# Patient Record
Sex: Male | Born: 1995 | Race: White | Hispanic: No | Marital: Single
Health system: Southern US, Community
[De-identification: ages and names within clinical notes are randomized; demographics above are authoritative.]

---

## 2020-11-10 ENCOUNTER — Emergency Department (HOSPITAL_COMMUNITY): Payer: Self-pay

## 2020-11-10 ENCOUNTER — Inpatient Hospital Stay (HOSPITAL_COMMUNITY)
Admission: EM | Admit: 2020-11-10 | Discharge: 2020-11-19 | DRG: 604 | Disposition: A | Discharge status: Home / Self Care | Payer: Self-pay | Attending: Surgery | Admitting: Surgery

## 2020-11-10 ENCOUNTER — Other Ambulatory Visit: Payer: Self-pay

## 2020-11-10 DIAGNOSIS — S21332D Puncture wound without foreign body of left front wall of thorax with penetration into thoracic cavity, subsequent encounter: Secondary | ICD-10-CM

## 2020-11-10 DIAGNOSIS — F1290 Cannabis use, unspecified, uncomplicated: Secondary | ICD-10-CM | POA: Diagnosis present

## 2020-11-10 DIAGNOSIS — S271XXA Traumatic hemothorax, initial encounter: Secondary | ICD-10-CM

## 2020-11-10 DIAGNOSIS — W3400XA Accidental discharge from unspecified firearms or gun, initial encounter: Secondary | ICD-10-CM

## 2020-11-10 DIAGNOSIS — J181 Lobar pneumonia, unspecified organism: Secondary | ICD-10-CM

## 2020-11-10 DIAGNOSIS — S272XXA Traumatic hemopneumothorax, initial encounter: Secondary | ICD-10-CM | POA: Diagnosis present

## 2020-11-10 DIAGNOSIS — F10129 Alcohol abuse with intoxication, unspecified: Secondary | ICD-10-CM | POA: Diagnosis present

## 2020-11-10 DIAGNOSIS — Y929 Unspecified place or not applicable: Secondary | ICD-10-CM

## 2020-11-10 DIAGNOSIS — D62 Acute posthemorrhagic anemia: Secondary | ICD-10-CM | POA: Diagnosis present

## 2020-11-10 DIAGNOSIS — S21332A Puncture wound without foreign body of left front wall of thorax with penetration into thoracic cavity, initial encounter: Secondary | ICD-10-CM

## 2020-11-10 DIAGNOSIS — S2242XA Multiple fractures of ribs, left side, initial encounter for closed fracture: Secondary | ICD-10-CM | POA: Diagnosis present

## 2020-11-10 DIAGNOSIS — U071 COVID-19: Secondary | ICD-10-CM | POA: Diagnosis present

## 2020-11-10 DIAGNOSIS — F1010 Alcohol abuse, uncomplicated: Secondary | ICD-10-CM | POA: Diagnosis present

## 2020-11-10 DIAGNOSIS — S27321A Contusion of lung, unilateral, initial encounter: Secondary | ICD-10-CM | POA: Diagnosis present

## 2020-11-10 DIAGNOSIS — J942 Hemothorax: Secondary | ICD-10-CM

## 2020-11-10 DIAGNOSIS — J9811 Atelectasis: Secondary | ICD-10-CM | POA: Diagnosis present

## 2020-11-10 DIAGNOSIS — Y906 Blood alcohol level of 120-199 mg/100 ml: Secondary | ICD-10-CM | POA: Diagnosis present

## 2020-11-10 DIAGNOSIS — Z4682 Encounter for fitting and adjustment of non-vascular catheter: Secondary | ICD-10-CM

## 2020-11-10 DIAGNOSIS — J939 Pneumothorax, unspecified: Secondary | ICD-10-CM

## 2020-11-10 DIAGNOSIS — F17200 Nicotine dependence, unspecified, uncomplicated: Secondary | ICD-10-CM | POA: Diagnosis present

## 2020-11-10 DIAGNOSIS — S21132A Puncture wound without foreign body of left front wall of thorax without penetration into thoracic cavity, initial encounter: Principal | ICD-10-CM | POA: Diagnosis present

## 2020-11-10 DIAGNOSIS — Z23 Encounter for immunization: Secondary | ICD-10-CM

## 2020-11-10 LAB — COMPREHENSIVE METABOLIC PANEL
ALT: 14 U/L (ref 0–44)
AST: 19 U/L (ref 15–41)
Albumin: 3.3 g/dL — ABNORMAL LOW (ref 3.5–5.0)
Alkaline Phosphatase: 56 U/L (ref 38–126)
Anion gap: 12 (ref 5–15)
BUN: 11 mg/dL (ref 6–20)
CO2: 20 mmol/L — ABNORMAL LOW (ref 22–32)
Calcium: 8.1 mg/dL — ABNORMAL LOW (ref 8.9–10.3)
Chloride: 105 mmol/L (ref 98–111)
Creatinine, Ser: 0.99 mg/dL (ref 0.61–1.24)
GFR, Estimated: >60 mL/min (ref >60)
Glucose, Bld: 145 mg/dL — ABNORMAL HIGH (ref 70–99)
Potassium: 3.3 mmol/L — ABNORMAL LOW (ref 3.5–5.1)
Sodium: 137 mmol/L (ref 135–145)
Total Bilirubin: 0.2 mg/dL — ABNORMAL LOW (ref 0.3–1.2)
Total Protein: 5.3 g/dL — ABNORMAL LOW (ref 6.5–8.1)

## 2020-11-10 LAB — RESP PANEL BY RT-PCR (FLU A&B, COVID) ARPGX2
Influenza A by PCR: NEGATIVE
Influenza B by PCR: NEGATIVE
SARS Coronavirus 2 by RT PCR: POSITIVE — AB

## 2020-11-10 LAB — CBC
HCT: 36.3 % — ABNORMAL LOW (ref 39.0–52.0)
Hemoglobin: 12.4 g/dL — ABNORMAL LOW (ref 13.0–17.0)
MCH: 32.9 pg (ref 26.0–34.0)
MCHC: 34.2 g/dL (ref 30.0–36.0)
MCV: 96.3 fL (ref 80.0–100.0)
Platelets: 221 10*3/uL (ref 150–400)
RBC: 3.77 MIL/uL — ABNORMAL LOW (ref 4.22–5.81)
RDW: 13.2 % (ref 11.5–15.5)
WBC: 13.5 10*3/uL — ABNORMAL HIGH (ref 4.0–10.5)
nRBC: 0 % (ref 0.0–0.2)

## 2020-11-10 LAB — I-STAT CHEM 8, ED
BUN: 13 mg/dL (ref 6–20)
Calcium, Ion: 1.03 mmol/L — ABNORMAL LOW (ref 1.15–1.40)
Chloride: 102 mmol/L (ref 98–111)
Creatinine, Ser: 1.1 mg/dL (ref 0.61–1.24)
Glucose, Bld: 137 mg/dL — ABNORMAL HIGH (ref 70–99)
HCT: 37 % — ABNORMAL LOW (ref 39.0–52.0)
Hemoglobin: 12.6 g/dL — ABNORMAL LOW (ref 13.0–17.0)
Potassium: 3.4 mmol/L — ABNORMAL LOW (ref 3.5–5.1)
Sodium: 139 mmol/L (ref 135–145)
TCO2: 22 mmol/L (ref 22–32)

## 2020-11-10 LAB — ETHANOL: Alcohol, Ethyl (B): 161 mg/dL — ABNORMAL HIGH (ref <10)

## 2020-11-10 LAB — PROTIME-INR
INR: 1.1 (ref 0.8–1.2)
Prothrombin Time: 13.5 seconds (ref 11.4–15.2)

## 2020-11-10 MED ORDER — LIDOCAINE-EPINEPHRINE (PF) 2 %-1:200000 IJ SOLN
20.0000 mL | Freq: Once | INTRAMUSCULAR | Status: DC
  Administered 2020-11-10: 20 mL

## 2020-11-10 MED ORDER — ONDANSETRON 4 MG PO TBDP: 4.0000 mg | ORAL_TABLET | Freq: Four times a day (QID) | ORAL | Status: DC | PRN

## 2020-11-10 MED ORDER — BISACODYL 10 MG RE SUPP: 10.0000 mg | Freq: Every day | RECTAL | Status: DC | PRN

## 2020-11-10 MED ORDER — HYDROMORPHONE HCL 1 MG/ML IJ SOLN
0.5000 mg | INTRAMUSCULAR | Status: DC | PRN
  Administered 2020-11-10 – 2020-11-11 (×3): 0.5 mg via INTRAVENOUS
  Filled 2020-11-10: qty 0.5
  Filled 2020-11-10: qty 1
  Filled 2020-11-10: qty 0.5

## 2020-11-10 MED ORDER — IOHEXOL 300 MG/ML  SOLN
75.0000 mL | Freq: Once | INTRAMUSCULAR | Status: AC | PRN
  Administered 2020-11-10: 75 mL via INTRAVENOUS

## 2020-11-10 MED ORDER — ACETAMINOPHEN 325 MG PO TABS
650.0000 mg | ORAL_TABLET | Freq: Four times a day (QID) | ORAL | Status: DC
  Administered 2020-11-10 – 2020-11-11 (×4): 650 mg via ORAL
  Filled 2020-11-10 (×4): qty 2

## 2020-11-10 MED ORDER — SODIUM CHLORIDE 0.9 % IV SOLN: INTRAVENOUS | Status: DC

## 2020-11-10 MED ORDER — LIDOCAINE-EPINEPHRINE 1 %-1:100000 IJ SOLN
10.0000 mL | Freq: Once | INTRAMUSCULAR | Status: AC
  Administered 2020-11-10: 10 mL

## 2020-11-10 MED ORDER — CEFAZOLIN SODIUM-DEXTROSE 2-4 GM/100ML-% IV SOLN
2.0000 g | Freq: Once | INTRAVENOUS | Status: AC
  Administered 2020-11-10: 2 g via INTRAVENOUS

## 2020-11-10 MED ORDER — FENTANYL CITRATE (PF) 100 MCG/2ML IJ SOLN
INTRAMUSCULAR | Status: AC
  Administered 2020-11-10: 100 ug via INTRAVENOUS
  Filled 2020-11-10: qty 2

## 2020-11-10 MED ORDER — MIDAZOLAM HCL 2 MG/2ML IJ SOLN
INTRAMUSCULAR | Status: AC
  Administered 2020-11-10: 2 mg via INTRAVENOUS
  Filled 2020-11-10: qty 4

## 2020-11-10 MED ORDER — TETANUS-DIPHTH-ACELL PERTUSSIS 5-2.5-18.5 LF-MCG/0.5 IM SUSY
0.5000 mL | PREFILLED_SYRINGE | Freq: Once | INTRAMUSCULAR | Status: AC
  Administered 2020-11-10: 0.5 mL via INTRAMUSCULAR

## 2020-11-10 MED ORDER — MIDAZOLAM HCL 2 MG/2ML IJ SOLN
2.0000 mg | Freq: Once | INTRAMUSCULAR | Status: AC
  Filled 2020-11-10: qty 2

## 2020-11-10 MED ORDER — OXYCODONE HCL 5 MG PO TABS
5.0000 mg | ORAL_TABLET | ORAL | Status: DC | PRN
  Administered 2020-11-11 (×3): 5 mg via ORAL
  Filled 2020-11-10 (×3): qty 1

## 2020-11-10 MED ORDER — METHOCARBAMOL 500 MG PO TABS: 500.0000 mg | ORAL_TABLET | Freq: Four times a day (QID) | ORAL | Status: DC | PRN

## 2020-11-10 MED ORDER — ONDANSETRON HCL 4 MG/2ML IJ SOLN: 4.0000 mg | Freq: Four times a day (QID) | INTRAMUSCULAR | Status: DC | PRN

## 2020-11-10 MED ORDER — DOCUSATE SODIUM 100 MG PO CAPS
100.0000 mg | ORAL_CAPSULE | Freq: Two times a day (BID) | ORAL | Status: DC
  Administered 2020-11-11 – 2020-11-18 (×17): 100 mg via ORAL
  Filled 2020-11-10 (×17): qty 1

## 2020-11-10 MED ORDER — FENTANYL CITRATE (PF) 100 MCG/2ML IJ SOLN
100.0000 ug | Freq: Once | INTRAMUSCULAR | Status: AC
  Filled 2020-11-10: qty 2

## 2020-11-10 MED ORDER — GABAPENTIN 300 MG PO CAPS
300.0000 mg | ORAL_CAPSULE | Freq: Three times a day (TID) | ORAL | Status: DC
  Administered 2020-11-10 – 2020-11-19 (×27): 300 mg via ORAL
  Filled 2020-11-10 (×28): qty 1
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
## 2020-11-10 NOTE — Procedures (Signed)
Chest Tube Insertion Procedure Note  Indications:  Clinically significant Hemothorax  Pre-operative Diagnosis: Hemothorax  Post-operative Diagnosis: Hemothorax  Procedure Details  Informed consent was obtained for the procedure, including sedation.  Risks of lung perforation, hemorrhage, arrhythmia, and adverse drug reaction were discussed.   After sterile skin prep, using standard technique, a 28 French tube was placed in the left lateral 5th rib space.  Findings: initial chest tube output into pleurvac and about initial drainage with entry to pleural space  Estimated Blood Loss:  Minimal         Specimens:  None              Complications:  None; patient tolerated the procedure well.         Disposition: ongoing trauma workup         Condition: stable
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
## 2020-11-11 ENCOUNTER — Inpatient Hospital Stay (HOSPITAL_COMMUNITY): Payer: Self-pay

## 2020-11-11 ENCOUNTER — Encounter (HOSPITAL_COMMUNITY): Payer: Self-pay

## 2020-11-11 LAB — HIV ANTIBODY (ROUTINE TESTING W REFLEX): HIV Screen 4th Generation wRfx: NONREACTIVE

## 2020-11-11 LAB — TYPE AND SCREEN
ABO/RH(D): O POS
Antibody Screen: NEGATIVE
Unit division: 0

## 2020-11-11 LAB — BASIC METABOLIC PANEL
Anion gap: 15 (ref 5–15)
BUN: 9 mg/dL (ref 6–20)
CO2: 15 mmol/L — ABNORMAL LOW (ref 22–32)
Calcium: 8.6 mg/dL — ABNORMAL LOW (ref 8.9–10.3)
Chloride: 105 mmol/L (ref 98–111)
Creatinine, Ser: 0.7 mg/dL (ref 0.61–1.24)
GFR, Estimated: >60 mL/min (ref >60)
Glucose, Bld: 153 mg/dL — ABNORMAL HIGH (ref 70–99)
Potassium: 4.2 mmol/L (ref 3.5–5.1)
Sodium: 135 mmol/L (ref 135–145)

## 2020-11-11 LAB — CBC
HCT: 39.5 % (ref 39.0–52.0)
HCT: 41 % (ref 39.0–52.0)
Hemoglobin: 14 g/dL (ref 13.0–17.0)
Hemoglobin: 14.2 g/dL (ref 13.0–17.0)
MCH: 32.2 pg (ref 26.0–34.0)
MCH: 31.6 pg (ref 26.0–34.0)
MCHC: 35.4 g/dL (ref 30.0–36.0)
MCHC: 34.6 g/dL (ref 30.0–36.0)
MCV: 90.8 fL (ref 80.0–100.0)
MCV: 91.3 fL (ref 80.0–100.0)
Platelets: 214 10*3/uL (ref 150–400)
Platelets: 198 10*3/uL (ref 150–400)
RBC: 4.35 MIL/uL (ref 4.22–5.81)
RBC: 4.49 MIL/uL (ref 4.22–5.81)
RDW: 13.5 % (ref 11.5–15.5)
RDW: 13.4 % (ref 11.5–15.5)
WBC: 17.4 10*3/uL — ABNORMAL HIGH (ref 4.0–10.5)
WBC: 16.7 10*3/uL — ABNORMAL HIGH (ref 4.0–10.5)
nRBC: 0 % (ref 0.0–0.2)
nRBC: 0 % (ref 0.0–0.2)

## 2020-11-11 LAB — GLUCOSE, CAPILLARY: Glucose-Capillary: 155 mg/dL — ABNORMAL HIGH (ref 70–99)

## 2020-11-11 LAB — BPAM RBC
Blood Product Expiration Date: 202203182359
ISSUE DATE / TIME: 202202212208
Unit Type and Rh: 5100

## 2020-11-11 LAB — BLOOD PRODUCT ORDER (VERBAL) VERIFICATION

## 2020-11-11 LAB — MRSA PCR SCREENING: MRSA by PCR: NEGATIVE

## 2020-11-11 LAB — ABO/RH: ABO/RH(D): O POS

## 2020-11-11 MED ORDER — ENOXAPARIN SODIUM 30 MG/0.3ML ~~LOC~~ SOLN
30.0000 mg | Freq: Two times a day (BID) | SUBCUTANEOUS | Status: DC
  Administered 2020-11-11 – 2020-11-18 (×15): 30 mg via SUBCUTANEOUS
  Filled 2020-11-11 (×16): qty 0.3

## 2020-11-11 MED ORDER — KETOROLAC TROMETHAMINE 15 MG/ML IJ SOLN
30.0000 mg | Freq: Four times a day (QID) | INTRAMUSCULAR | Status: AC
  Administered 2020-11-11 – 2020-11-16 (×19): 30 mg via INTRAVENOUS
  Filled 2020-11-11 (×20): qty 2

## 2020-11-11 MED ORDER — CHLORHEXIDINE GLUCONATE CLOTH 2 % EX PADS
6.0000 | MEDICATED_PAD | Freq: Every day | CUTANEOUS | Status: DC
  Administered 2020-11-11 – 2020-11-18 (×9): 6 via TOPICAL

## 2020-11-11 MED ORDER — ACETAMINOPHEN 500 MG PO TABS
1000.0000 mg | ORAL_TABLET | Freq: Four times a day (QID) | ORAL | Status: DC
  Administered 2020-11-11 – 2020-11-19 (×28): 1000 mg via ORAL
  Filled 2020-11-11 (×30): qty 2

## 2020-11-11 MED ORDER — HYDROMORPHONE HCL 1 MG/ML IJ SOLN
0.2500 mg | Freq: Four times a day (QID) | INTRAMUSCULAR | Status: DC | PRN
Start: 2020-11-11 — End: 2020-11-20

## 2020-11-11 MED ORDER — FOLIC ACID 1 MG PO TABS
1.0000 mg | ORAL_TABLET | Freq: Every day | ORAL | Status: DC
  Administered 2020-11-11 – 2020-11-19 (×9): 1 mg via ORAL
  Filled 2020-11-11 (×9): qty 1

## 2020-11-11 MED ORDER — METHOCARBAMOL 500 MG PO TABS
500.0000 mg | ORAL_TABLET | Freq: Four times a day (QID) | ORAL | Status: DC
  Administered 2020-11-11 (×2): 500 mg via ORAL
  Filled 2020-11-11 (×2): qty 1

## 2020-11-11 MED ORDER — NICOTINE 7 MG/24HR TD PT24
7.0000 mg | MEDICATED_PATCH | Freq: Every day | TRANSDERMAL | Status: DC
  Administered 2020-11-11 – 2020-11-12 (×2): 7 mg via TRANSDERMAL
  Filled 2020-11-11 (×6): qty 1

## 2020-11-11 MED ORDER — THIAMINE HCL 100 MG PO TABS
100.0000 mg | ORAL_TABLET | Freq: Every day | ORAL | Status: DC
  Administered 2020-11-11 – 2020-11-19 (×9): 100 mg via ORAL
  Filled 2020-11-11 (×9): qty 1

## 2020-11-11 MED ORDER — METHOCARBAMOL 500 MG PO TABS
1000.0000 mg | ORAL_TABLET | Freq: Three times a day (TID) | ORAL | Status: DC
  Administered 2020-11-11 – 2020-11-12 (×2): 1000 mg via ORAL
  Filled 2020-11-11 (×2): qty 2
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
## 2020-11-12 ENCOUNTER — Inpatient Hospital Stay (HOSPITAL_COMMUNITY): Payer: Self-pay

## 2020-11-12 LAB — CBC
HCT: 27.9 % — ABNORMAL LOW (ref 39.0–52.0)
HCT: 28.4 % — ABNORMAL LOW (ref 39.0–52.0)
Hemoglobin: 10 g/dL — ABNORMAL LOW (ref 13.0–17.0)
Hemoglobin: 9.8 g/dL — ABNORMAL LOW (ref 13.0–17.0)
MCH: 32.5 pg (ref 26.0–34.0)
MCH: 32.1 pg (ref 26.0–34.0)
MCHC: 35.8 g/dL (ref 30.0–36.0)
MCHC: 34.5 g/dL (ref 30.0–36.0)
MCV: 90.6 fL (ref 80.0–100.0)
MCV: 93.1 fL (ref 80.0–100.0)
Platelets: 131 10*3/uL — ABNORMAL LOW (ref 150–400)
Platelets: 140 10*3/uL — ABNORMAL LOW (ref 150–400)
RBC: 3.08 MIL/uL — ABNORMAL LOW (ref 4.22–5.81)
RBC: 3.05 MIL/uL — ABNORMAL LOW (ref 4.22–5.81)
RDW: 13.8 % (ref 11.5–15.5)
RDW: 13.7 % (ref 11.5–15.5)
WBC: 7.6 10*3/uL (ref 4.0–10.5)
WBC: 8.3 10*3/uL (ref 4.0–10.5)
nRBC: 0 % (ref 0.0–0.2)
nRBC: 0 % (ref 0.0–0.2)

## 2020-11-12 LAB — BASIC METABOLIC PANEL
Anion gap: 9 (ref 5–15)
BUN: 12 mg/dL (ref 6–20)
CO2: 21 mmol/L — ABNORMAL LOW (ref 22–32)
Calcium: 8.4 mg/dL — ABNORMAL LOW (ref 8.9–10.3)
Chloride: 104 mmol/L (ref 98–111)
Creatinine, Ser: 0.71 mg/dL (ref 0.61–1.24)
GFR, Estimated: >60 mL/min (ref >60)
Glucose, Bld: 111 mg/dL — ABNORMAL HIGH (ref 70–99)
Potassium: 3.6 mmol/L (ref 3.5–5.1)
Sodium: 134 mmol/L — ABNORMAL LOW (ref 135–145)

## 2020-11-12 LAB — CDS SEROLOGY

## 2020-11-12 MED ORDER — METHOCARBAMOL 500 MG PO TABS
1000.0000 mg | ORAL_TABLET | Freq: Four times a day (QID) | ORAL | Status: DC
  Administered 2020-11-12 – 2020-11-17 (×18): 1000 mg via ORAL
  Filled 2020-11-12 (×18): qty 2

## 2020-11-12 MED ORDER — OXYCODONE HCL 5 MG PO TABS
5.0000 mg | ORAL_TABLET | ORAL | Status: DC | PRN
  Administered 2020-11-12 – 2020-11-13 (×2): 5 mg via ORAL
  Administered 2020-11-14 – 2020-11-17 (×8): 10 mg via ORAL
  Filled 2020-11-12 (×5): qty 2
  Filled 2020-11-12: qty 1
  Filled 2020-11-12 (×2): qty 2
  Filled 2020-11-12: qty 1
  Filled 2020-11-12: qty 2

## 2020-11-12 MED ORDER — GUAIFENESIN ER 600 MG PO TB12
600.0000 mg | ORAL_TABLET | Freq: Two times a day (BID) | ORAL | Status: DC
  Administered 2020-11-12 – 2020-11-19 (×15): 600 mg via ORAL
  Filled 2020-11-12 (×15): qty 1

## 2020-11-12 MED ORDER — LIDOCAINE 5 % EX PTCH
1.0000 | MEDICATED_PATCH | CUTANEOUS | Status: DC
  Administered 2020-11-12 – 2020-11-19 (×8): 1 via TRANSDERMAL
  Filled 2020-11-12 (×8): qty 1
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
## 2020-11-13 ENCOUNTER — Inpatient Hospital Stay (HOSPITAL_COMMUNITY): Payer: Self-pay

## 2020-11-13 LAB — CBC
HCT: 26.9 % — ABNORMAL LOW (ref 39.0–52.0)
Hemoglobin: 9.4 g/dL — ABNORMAL LOW (ref 13.0–17.0)
MCH: 33 pg (ref 26.0–34.0)
MCHC: 34.9 g/dL (ref 30.0–36.0)
MCV: 94.4 fL (ref 80.0–100.0)
Platelets: 133 10*3/uL — ABNORMAL LOW (ref 150–400)
RBC: 2.85 MIL/uL — ABNORMAL LOW (ref 4.22–5.81)
RDW: 13.7 % (ref 11.5–15.5)
WBC: 7.3 10*3/uL (ref 4.0–10.5)
nRBC: 0 % (ref 0.0–0.2)

## 2020-11-13 LAB — BASIC METABOLIC PANEL
Anion gap: 9 (ref 5–15)
BUN: 7 mg/dL (ref 6–20)
CO2: 22 mmol/L (ref 22–32)
Calcium: 8.5 mg/dL — ABNORMAL LOW (ref 8.9–10.3)
Chloride: 107 mmol/L (ref 98–111)
Creatinine, Ser: 0.66 mg/dL (ref 0.61–1.24)
GFR, Estimated: >60 mL/min (ref >60)
Glucose, Bld: 112 mg/dL — ABNORMAL HIGH (ref 70–99)
Potassium: 4 mmol/L (ref 3.5–5.1)
Sodium: 138 mmol/L (ref 135–145)
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
## 2020-11-14 ENCOUNTER — Inpatient Hospital Stay (HOSPITAL_COMMUNITY): Payer: Self-pay

## 2020-11-14 DIAGNOSIS — W3409XA Accidental discharge from other specified firearms, initial encounter: Secondary | ICD-10-CM

## 2020-11-14 DIAGNOSIS — U071 COVID-19: Secondary | ICD-10-CM

## 2020-11-14 DIAGNOSIS — S271XXA Traumatic hemothorax, initial encounter: Secondary | ICD-10-CM

## 2020-11-14 MED ORDER — POLYETHYLENE GLYCOL 3350 17 G PO PACK
17.0000 g | PACK | Freq: Every day | ORAL | Status: DC
  Administered 2020-11-17: 17 g via ORAL
  Filled 2020-11-14 (×4): qty 1

## 2020-11-14 MED ORDER — IOHEXOL 300 MG/ML  SOLN
75.0000 mL | Freq: Once | INTRAMUSCULAR | Status: AC | PRN
  Administered 2020-11-14: 75 mL via INTRAVENOUS

## 2020-11-14 NOTE — Plan of Care (Signed)
  Problem: Education: Goal: Knowledge of General Education information will improve Description: Including pain rating scale, medication(s)/side effects and non-pharmacologic comfort measures 11/14/2020 1652 by Montez Hageman, RN Outcome: Progressing 11/14/2020 1652 by Montez Hageman, RN Outcome: Progressing   Problem: Health Behavior/Discharge Planning: Goal: Ability to manage health-related needs will improve 11/14/2020 1652 by Montez Hageman, RN Outcome: Progressing 11/14/2020 1652 by Montez Hageman, RN Outcome: Progressing   Problem: Clinical Measurements: Goal: Ability to maintain clinical measurements within normal limits will improve 11/14/2020 1652 by Montez Hageman, RN Outcome: Progressing 11/14/2020 1652 by Montez Hageman, RN Outcome: Progressing Goal: Will remain free from infection 11/14/2020 1652 by Montez Hageman, RN Outcome: Progressing 11/14/2020 1652 by Montez Hageman, RN Outcome: Progressing Goal: Diagnostic test results will improve 11/14/2020 1652 by Montez Hageman, RN Outcome: Progressing 11/14/2020 1652 by Montez Hageman, RN Outcome: Progressing Goal: Respiratory complications will improve 11/14/2020 1652 by Montez Hageman, RN Outcome: Progressing 11/14/2020 1652 by Montez Hageman, RN Outcome: Progressing Goal: Cardiovascular complication will be avoided 11/14/2020 1652 by Montez Hageman, RN Outcome: Progressing 11/14/2020 1652 by Montez Hageman, RN Outcome: Progressing   Problem: Activity: Goal: Risk for activity intolerance will decrease 11/14/2020 1652 by Montez Hageman, RN Outcome: Progressing 11/14/2020 1652 by Montez Hageman, RN Outcome: Progressing   Problem: Nutrition: Goal: Adequate nutrition will be maintained 11/14/2020 1652 by Montez Hageman, RN Outcome: Progressing 11/14/2020 1652 by Montez Hageman, RN Outcome: Progressing   Problem: Coping: Goal: Level  of anxiety will decrease 11/14/2020 1652 by Montez Hageman, RN Outcome: Progressing 11/14/2020 1652 by Montez Hageman, RN Outcome: Progressing   Problem: Elimination: Goal: Will not experience complications related to bowel motility 11/14/2020 1652 by Montez Hageman, RN Outcome: Progressing 11/14/2020 1652 by Montez Hageman, RN Outcome: Progressing Goal: Will not experience complications related to urinary retention 11/14/2020 1652 by Montez Hageman, RN Outcome: Progressing 11/14/2020 1652 by Montez Hageman, RN Outcome: Progressing   Problem: Pain Managment: Goal: General experience of comfort will improve 11/14/2020 1652 by Montez Hageman, RN Outcome: Progressing 11/14/2020 1652 by Montez Hageman, RN Outcome: Progressing   Problem: Safety: Goal: Ability to remain free from injury will improve 11/14/2020 1652 by Montez Hageman, RN Outcome: Progressing 11/14/2020 1652 by Montez Hageman, RN Outcome: Progressing   Problem: Skin Integrity: Goal: Risk for impaired skin integrity will decrease 11/14/2020 1652 by Montez Hageman, RN Outcome: Progressing 11/14/2020 1652 by Montez Hageman, RN Outcome: Progressing
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
## 2020-11-14 NOTE — Plan of Care (Signed)
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
## 2020-11-15 ENCOUNTER — Inpatient Hospital Stay (HOSPITAL_COMMUNITY): Payer: Self-pay

## 2020-11-15 LAB — CBC
HCT: 27.1 % — ABNORMAL LOW (ref 39.0–52.0)
Hemoglobin: 9.4 g/dL — ABNORMAL LOW (ref 13.0–17.0)
MCH: 32 pg (ref 26.0–34.0)
MCHC: 34.7 g/dL (ref 30.0–36.0)
MCV: 92.2 fL (ref 80.0–100.0)
Platelets: 217 10*3/uL (ref 150–400)
RBC: 2.94 MIL/uL — ABNORMAL LOW (ref 4.22–5.81)
RDW: 13.1 % (ref 11.5–15.5)
WBC: 7.6 10*3/uL (ref 4.0–10.5)
nRBC: 0 % (ref 0.0–0.2)

## 2020-11-15 LAB — BASIC METABOLIC PANEL
Anion gap: 9 (ref 5–15)
BUN: 7 mg/dL (ref 6–20)
CO2: 23 mmol/L (ref 22–32)
Calcium: 8.7 mg/dL — ABNORMAL LOW (ref 8.9–10.3)
Chloride: 105 mmol/L (ref 98–111)
Creatinine, Ser: 0.71 mg/dL (ref 0.61–1.24)
GFR, Estimated: >60 mL/min (ref >60)
Glucose, Bld: 104 mg/dL — ABNORMAL HIGH (ref 70–99)
Potassium: 3.6 mmol/L (ref 3.5–5.1)
Sodium: 137 mmol/L (ref 135–145)

## 2020-11-15 MED ORDER — POTASSIUM CHLORIDE CRYS ER 20 MEQ PO TBCR
40.0000 meq | EXTENDED_RELEASE_TABLET | Freq: Once | ORAL | Status: AC
  Administered 2020-11-15: 40 meq via ORAL
  Filled 2020-11-15: qty 2
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
## 2020-11-15 NOTE — Plan of Care (Signed)

## 2020-11-16 ENCOUNTER — Inpatient Hospital Stay (HOSPITAL_COMMUNITY): Payer: Self-pay
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
## 2020-11-17 ENCOUNTER — Inpatient Hospital Stay (HOSPITAL_COMMUNITY): Payer: Self-pay

## 2020-11-17 LAB — CBC
HCT: 28.9 % — ABNORMAL LOW (ref 39.0–52.0)
Hemoglobin: 10.4 g/dL — ABNORMAL LOW (ref 13.0–17.0)
MCH: 32 pg (ref 26.0–34.0)
MCHC: 36 g/dL (ref 30.0–36.0)
MCV: 88.9 fL (ref 80.0–100.0)
Platelets: 264 10*3/uL (ref 150–400)
RBC: 3.25 MIL/uL — ABNORMAL LOW (ref 4.22–5.81)
RDW: 12.6 % (ref 11.5–15.5)
WBC: 4.8 10*3/uL (ref 4.0–10.5)
nRBC: 0 % (ref 0.0–0.2)

## 2020-11-17 LAB — BASIC METABOLIC PANEL
Anion gap: 11 (ref 5–15)
BUN: 11 mg/dL (ref 6–20)
CO2: 19 mmol/L — ABNORMAL LOW (ref 22–32)
Calcium: 9 mg/dL (ref 8.9–10.3)
Chloride: 106 mmol/L (ref 98–111)
Creatinine, Ser: 0.73 mg/dL (ref 0.61–1.24)
GFR, Estimated: >60 mL/min (ref >60)
Glucose, Bld: 96 mg/dL (ref 70–99)
Potassium: 4.9 mmol/L (ref 3.5–5.1)
Sodium: 136 mmol/L (ref 135–145)

## 2020-11-17 MED ORDER — OXYCODONE HCL 5 MG PO TABS
5.0000 mg | ORAL_TABLET | Freq: Four times a day (QID) | ORAL | Status: DC | PRN
  Administered 2020-11-17: 10 mg via ORAL
  Administered 2020-11-18: 5 mg via ORAL
  Administered 2020-11-18: 10 mg via ORAL
  Filled 2020-11-17 (×2): qty 2
  Filled 2020-11-17: qty 1

## 2020-11-17 MED ORDER — METHOCARBAMOL 500 MG PO TABS
1000.0000 mg | ORAL_TABLET | Freq: Four times a day (QID) | ORAL | Status: DC | PRN
  Administered 2020-11-18: 1000 mg via ORAL
  Filled 2020-11-17 (×2): qty 2
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
## 2020-11-18 ENCOUNTER — Inpatient Hospital Stay (HOSPITAL_COMMUNITY): Payer: Self-pay
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
## 2020-11-19 ENCOUNTER — Inpatient Hospital Stay (HOSPITAL_COMMUNITY): Payer: Self-pay

## 2020-11-19 ENCOUNTER — Other Ambulatory Visit: Payer: Self-pay | Admitting: Physician Assistant

## 2020-11-19 MED ORDER — ACETAMINOPHEN 500 MG PO TABS: 1000.0000 mg | ORAL_TABLET | Freq: Four times a day (QID) | ORAL | 0 refills | Status: AC | PRN

## 2020-11-19 MED ORDER — METHOCARBAMOL 500 MG PO TABS
1000.0000 mg | ORAL_TABLET | Freq: Four times a day (QID) | ORAL | 0 refills | Status: AC | PRN
Start: 2020-11-19 — End: ?

## 2020-11-19 MED ORDER — GUAIFENESIN ER 600 MG PO TB12: 600.0000 mg | ORAL_TABLET | Freq: Two times a day (BID) | ORAL | Status: AC | PRN

## 2020-11-19 MED ORDER — OXYCODONE HCL 5 MG PO TABS: 5.0000 mg | ORAL_TABLET | Freq: Four times a day (QID) | ORAL | Status: DC | PRN

## 2020-11-19 MED ORDER — POLYETHYLENE GLYCOL 3350 17 G PO PACK: 17.0000 g | PACK | Freq: Two times a day (BID) | ORAL | Status: DC

## 2020-11-19 MED ORDER — OXYCODONE HCL 5 MG PO TABS: 5.0000 mg | ORAL_TABLET | Freq: Four times a day (QID) | ORAL | 0 refills | Status: AC | PRN

## 2020-11-19 MED ORDER — POLYETHYLENE GLYCOL 3350 17 G PO PACK: 17.0000 g | PACK | Freq: Every day | ORAL | 0 refills | Status: AC | PRN

## 2020-11-19 MED FILL — METHOCARBAMOL 500 MG TABS: 500 | 4 days supply | Qty: 20 | Fill #0

## 2020-11-19 MED FILL — oxyCODONE HCL 5 MG TABS: 5 | 4 days supply | Qty: 20 | Fill #0
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
## 2020-11-19 NOTE — Progress Notes (Signed)
Patient walked down to private car with staff, belongings and discharge paperwork at side.
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
## 2020-11-19 NOTE — Discharge Instructions (Signed)
PNEUMOTHORAX OR HEMOTHORAX +/- RIB FRACTURES  HOME INSTRUCTIONS   1. PAIN CONTROL:  1. Pain is best controlled by a usual combination of three different methods TOGETHER:  i. Ice/Heat ii. Over the counter pain medication iii. Prescription pain medication 2. You may experience some swelling and bruising in area of broken ribs. Ice packs or heating pads (30-60 minutes up to 6 times a day) will help. Use ice for the first few days to help decrease swelling and bruising, then switch to heat to help relax tight/sore spots and speed recovery. Some people prefer to use ice alone, heat alone, alternating between ice & heat. Experiment to what works for you. Swelling and bruising can take several weeks to resolve.  3. It is helpful to take an over-the-counter pain medication regularly for the first few weeks. Choose one of the following that works best for you:  i. Naproxen (Aleve, etc) Two 220mg tabs twice a day ii. Ibuprofen (Advil, etc) Three 200mg tabs four times a day (every meal & bedtime) iii. Acetaminophen (Tylenol, etc) 500-650mg four times a day (every meal & bedtime) 4. A prescription for pain medication (such as oxycodone, hydrocodone, etc) may be given to you upon discharge. Take your pain medication as prescribed.  i. If you are having problems/concerns with the prescription medicine (does not control pain, nausea, vomiting, rash, itching, etc), please call us [phone] to see if we need to switch you to a different pain medicine that will work better for you and/or control your side effect better. ii. If you need a refill on your pain medication, please contact your pharmacy. They will contact our office to request authorization. Prescriptions will not be filled after 5 pm or on week-ends. 1. Avoid getting constipated. When taking pain medications, it is common to experience some constipation. Increasing fluid intake and taking a fiber supplement (such as Metamucil, Citrucel, FiberCon,  MiraLax, etc) 1-2 times a day regularly will usually help prevent this problem from occurring. A mild laxative (prune juice, Milk of Magnesia, MiraLax, etc) should be taken according to package directions if there are no bowel movements after 48 hours.  2. Watch out for diarrhea. If you have many loose bowel movements, simplify your diet to bland foods & liquids for a few days. Stop any stool softeners and decrease your fiber supplement. Switching to mild anti-diarrheal medications (Kayopectate, Pepto Bismol) can help. If this worsens or does not improve, please call us. 3. Chest tube site wound: you may remove the dressing from your chest tube site 3 days after the removal of your chest tube. DO NOT shower over the dressing. Once   removed, you may shower as normal. Do not submerge your wound in water for 2-3 weeks.  4. FOLLOW UP  a. Please call our office to set up or confirm an appointment for follow up for 2 weeks after discharge. You will need to get a chest xray at either [facility] or [facility]. This will be outlined in your follow up instructions. Please call [facility] at [phone] if you have any questions about follow up.  b. If you have any orthopedic or other injuries you will need to follow up as outlined in your follow up instructions.   WHEN TO CALL US [phone]:  1. Poor pain control 2. Reactions / problems with new medications (rash/itching, nausea, etc)  3. Fever over 101.5 F (38.5 C) 4. Worsening swelling or bruising 5. Redness, drainage, pain or swelling around chest   tube site 6. Worsening pain, productive cough, difficulty breathing or any other concerning symptoms  The clinic staff is available to answer your questions during regular business hours (8:30am-5pm). Please don't hesitate to call and ask to speak to one of our nurses for clinical concerns.  If you have a medical emergency, go to the nearest emergency room or call 911.  A surgeon from [facility] is always on call at the hospitals   [facility]  [address], [address], [place], [place] [address] ?  MAIN: [phone] ? TOLL FREE: [phone] ?  FAX [phone]  [email or web address]      Information on Rib Fractures  A rib fracture is a break or crack in one of the bones of the ribs. The ribs are long, curved bones that wrap around your chest and attach to your spine and your breastbone. The ribs protect your heart, lungs, and other organs in the chest. A broken or cracked rib is often painful but is not usually serious. Most rib fractures heal on their own over time. However, rib fractures can be more serious if multiple ribs are broken or if broken ribs move out of place and push against other structures or organs. What are the causes? This condition is caused by:  Repetitive movements with high force, such as pitching a baseball or having severe coughing spells.  A direct blow to the chest, such as a sports injury, a car accident, or a fall.  Cancer that has spread to the bones, which can weaken bones and cause them to break. What are the signs or symptoms? Symptoms of this condition include:  Pain when you breathe in or cough.  Pain when someone presses on the injured area.  Feeling short of breath. How is this diagnosed? This condition is diagnosed with a physical exam and medical history. Imaging tests may also be done, such as:  Chest X-ray.  CT scan.  MRI.  Bone scan.  Chest ultrasound. How is this treated? Treatment for this condition depends on the severity of the fracture. Most rib fractures usually heal on their own in 1-3 months. Sometimes healing takes longer if there is a cough that does not stop or if there are other activities that make the injury worse (aggravating factors). While you heal, you will be given medicines to control the pain. You will also be taught deep breathing  exercises. Severe injuries may require hospitalization or surgery. Follow these instructions at home: Managing pain, stiffness, and swelling  If directed, apply ice to the injured area. ? Put ice in a plastic bag. ? Place a towel between your skin and the bag. ? Leave the ice on for 20 minutes, 2-3 times a day.  Take over-the-counter and prescription medicines only as told by your health care provider. Activity  Avoid a lot of activity and any activities or movements that cause pain. Be careful during activities and avoid bumping the injured rib.  Slowly increase your activity as told by your health care provider. General instructions  Do deep breathing exercises as told by your health care provider. This helps prevent pneumonia, which is a common complication of a broken rib. Your health care provider may instruct you to: ? Take deep breaths several times a day. ? Try to cough several times a day, holding a pillow against the injured area. ? Use a device called incentive spirometer to practice deep breathing several times a day.  Drink enough   fluid to keep your urine pale yellow.  Do not wear a rib belt or binder. These restrict breathing, which can lead to pneumonia.  Keep all follow-up visits as told by your health care provider. This is important. Contact a health care provider if:  You have a fever. Get help right away if:  You have difficulty breathing or you are short of breath.  You develop a cough that does not stop, or you cough up thick or bloody sputum.  You have nausea, vomiting, or pain in your abdomen.  Your pain gets worse and medicine does not help. Summary  A rib fracture is a break or crack in one of the bones of the ribs.  A broken or cracked rib is often painful but is not usually serious.  Most rib fractures heal on their own over time.  Treatment for this condition depends on the severity of the fracture.  Avoid a lot of activity and any  activities or movements that cause pain. This information is not intended to replace advice given to you by your health care provider. Make sure you discuss any questions you have with your health care provider. Document Released: 09/06/2005 Document Revised: 12/06/2016 Document Reviewed: 12/06/2016 Elsevier Interactive Patient Education  2019 [employer] Inc.    Pneumothorax A pneumothorax is commonly called a collapsed lung. It is a condition in which air leaks from a lung and builds up between the thin layer of tissue that covers the lungs (visceral pleura) and the interior wall of the chest cavity (parietal pleura). The air gets trapped outside the lung, between the lung and the chest wall (pleural space). The air takes up space and prevents the lung from fully expanding. This condition sometimes occurs suddenly with no apparent cause. The buildup of air may be small or large. A small pneumothorax may go away on its own. A large pneumothorax will require treatment and hospitalization. What are the causes? This condition may be caused by:  Trauma and injury to the chest wall.  Surgery and other medical procedures.  A complication of an underlying lung problem, especially chronic obstructive pulmonary disease (COPD) or emphysema. Sometimes the cause of this condition is not known. What increases the risk? You are more likely to develop this condition if:  You have an underlying lung problem.  You smoke.  You are 20-40 years old, male, tall, and underweight.  You have a personal or family history of pneumothorax.  You have an eating disorder (anorexia nervosa). This condition can also happen quickly, even in people with no history of lung problems. What are the signs or symptoms? Sometimes a pneumothorax will have no symptoms. When symptoms are present, they can include:  Chest pain.  Shortness of breath.  Increased rate of breathing.  Bluish color to your lips or skin  (cyanosis). How is this diagnosed? This condition may be diagnosed by:  A medical history and physical exam.  A chest X-ray, chest CT scan, or ultrasound. How is this treated? Treatment depends on how severe your condition is. The goal of treatment is to remove the extra air and allow your lung to expand back to its normal size.  For a small pneumothorax: ? No treatment may be needed. ? Extra oxygen is sometimes used to make it go away more quickly.  For a large pneumothorax or a pneumothorax that is causing symptoms, a procedure is done to drain the air from your lungs. To do this, a health care provider may   use: ? A needle with a syringe. This is used to suck air from a pleural space where no additional leakage is taking place. ? A chest tube. This is used to suck air where there is ongoing leakage into the pleural space. The chest tube may need to remain in place for several days until the air leak has healed.  In more severe cases, surgery may be needed to repair the damage that is causing the leak.  If you have multiple pneumothorax episodes or have an air leak that will not heal, a procedure called a pleurodesis may be done. A medicine is placed in the pleural space to irritate the tissues around the lung so that the lung will stick to the chest wall, seal any leaks, and stop any buildup of air in that space. If you have an underlying lung problem, severe symptoms, or a large pneumothorax you will usually need to stay in the hospital. Follow these instructions at home: Lifestyle  Do not use any products that contain nicotine or tobacco, such as cigarettes and e-cigarettes. These are major risk factors in pneumothorax. If you need help quitting, ask your health care provider.  Do not lift anything that is heavier than 10 lb (4.5 kg), or the limit that your health care provider tells you, until he or she says that it is safe.  Avoid activities that take a lot of effort (strenuous)  for as long as told by your health care provider.  Return to your normal activities as told by your health care provider. Ask your health care provider what activities are safe for you.  Do not fly in an airplane or scuba dive until your health care provider says it is okay. General instructions  Take over-the-counter and prescription medicines only as told by your health care provider.  If a cough or pain makes it difficult for you to sleep at night, try sleeping in a semi-upright position in a recliner or by using 2 or 3 pillows.  If you had a chest tube and it was removed, ask your health care provider when you can remove the bandage (dressing). While the dressing is in place, do not allow it to get wet.  Keep all follow-up visits as told by your health care provider. This is important. Contact a health care provider if:  You cough up thick mucus (sputum) that is yellow or green in color.  You were treated with a chest tube, and you have redness, increasing pain, or discharge at the site where it was placed. Get help right away if:  You have increasing chest pain or shortness of breath.  You have a cough that will not go away.  You begin coughing up blood.  You have pain that is getting worse or is not controlled with medicines.  The site where your chest tube was located opens up.  You feel air coming out of the site where the chest tube was placed.  You have a fever or persistent symptoms for more than 2-3 days.  You have a fever and your symptoms suddenly get worse. These symptoms may represent a serious problem that is an emergency. Do not wait to see if the symptoms will go away. Get medical help right away. Call your local emergency services (911 in the [place] Do not drive yourself to the hospital. Summary  A pneumothorax, commonly called a collapsed lung, is a condition in which air leaks from a lung and gets trapped between the   lung and the chest wall (pleural  space).  The buildup of air may be small or large. A small pneumothorax may go away on its own. A large pneumothorax will require treatment and hospitalization.  Treatment for this condition depends on how severe the pneumothorax is. The goal of treatment is to remove the extra air and allow the lung to expand back to its normal size. This information is not intended to replace advice given to you by your health care provider. Make sure you discuss any questions you have with your health care provider. Document Released: 09/06/2005 Document Revised: 08/15/2017 Document Reviewed: 08/15/2017 Elsevier Interactive Patient Education  2019 Elsevier Inc.   

## 2020-11-19 NOTE — Progress Notes (Signed)
Lonnie Middleton. to be D/C'd  per MD order. Discussed with the patient and all questions fully answered.  VSS, Skin clean, dry and intact without evidence of skin break down, no evidence of skin tears noted.  IV catheter discontinued intact. Site without signs and symptoms of complications. Dressing and pressure applied.  An After Visit Summary was printed and given to the patient. Patient received prescription.  D/c education completed with patient/family including follow up instructions, medication list, d/c activities limitations if indicated, with other d/c instructions as indicated by MD - patient able to verbalize understanding, all questions fully answered.   Patient instructed to return to ED, call 911, or call MD for any changes in condition.   Patient to be escorted via WC, and D/C home via private auto.
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
